# Patient Record
Sex: Male | Born: 1990 | Race: Black or African American | Hispanic: No | Marital: Single | Smoking: Current every day smoker
Health system: Southern US, Community
[De-identification: ages and names within clinical notes are randomized; demographics above are authoritative.]

## PROBLEM LIST (undated history)

## (undated) DIAGNOSIS — J111 Influenza due to unidentified influenza virus with other respiratory manifestations: Secondary | ICD-10-CM

## (undated) HISTORY — DX: Influenza due to unidentified influenza virus with other respiratory manifestations: J11.1

---
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
## 2013-04-14 ENCOUNTER — Encounter (HOSPITAL_COMMUNITY): Payer: Self-pay | Admitting: Emergency Medicine

## 2013-04-14 ENCOUNTER — Emergency Department (INDEPENDENT_AMBULATORY_CARE_PROVIDER_SITE_OTHER)
Admission: EM | Admit: 2013-04-14 | Discharge: 2013-04-14 | Disposition: A | Discharge status: Home / Self Care | Payer: Self-pay | Source: Home / Self Care

## 2013-04-14 DIAGNOSIS — J111 Influenza due to unidentified influenza virus with other respiratory manifestations: Secondary | ICD-10-CM

## 2013-04-14 MED ORDER — OSELTAMIVIR PHOSPHATE 75 MG PO CAPS: 75.0000 mg | ORAL_CAPSULE | Freq: Two times a day (BID) | ORAL | Status: DC

## 2013-04-14 NOTE — Discharge Instructions (Signed)
Influenza, Adult Influenza ("the flu") is a viral infection of the respiratory tract. It occurs more often in winter months because people spend more time in close contact with one another. Influenza can make you feel very sick. Influenza easily spreads from person to person (contagious). CAUSES  Influenza is caused by a virus that infects the respiratory tract. You can catch the virus by breathing in droplets from an infected person's cough or sneeze. You can also catch the virus by touching something that was recently contaminated with the virus and then touching your mouth, nose, or eyes. SYMPTOMS  Symptoms typically last 4 to 10 days and may include:  Fever.  Chills.  Headache, body aches, and muscle aches.  Sore throat.  Chest discomfort and cough.  Poor appetite.  Weakness or feeling tired.  Dizziness.  Nausea or vomiting. DIAGNOSIS  Diagnosis of influenza is often made based on your history and a physical exam. A nose or throat swab test can be done to confirm the diagnosis. RISKS AND COMPLICATIONS You may be at risk for a more severe case of influenza if you smoke cigarettes, have diabetes, have chronic heart disease (such as heart failure) or lung disease (such as asthma), or if you have a weakened immune system. Elderly people and pregnant women are also at risk for more serious infections. The most common complication of influenza is a lung infection (pneumonia). Sometimes, this complication can require emergency medical care and may be life-threatening. PREVENTION  An annual influenza vaccination (flu shot) is the best way to avoid getting influenza. An annual flu shot is now routinely recommended for all adults in the [place] TREATMENT  In mild cases, influenza goes away on its own. Treatment is directed at relieving symptoms. For more severe cases, your caregiver may prescribe antiviral medicines to shorten the sickness. Antibiotic medicines are not effective, because the  infection is caused by a virus, not by bacteria. HOME CARE INSTRUCTIONS  Only take over-the-counter or prescription medicines for pain, discomfort, or fever as directed by your caregiver.  Alka Seltzer Cold Plus Night Time medicine and Robitussin DM. Ibuprofen 200 mg, take 3 tablets every 6 hours as needed.   Use a cool mist humidifier to make breathing easier.  Get plenty of rest until your temperature returns to normal. This usually takes 3 to 4 days.  Drink enough fluids to keep your urine clear or pale yellow.  Cover your mouth and nose when coughing or sneezing, and wash your hands well to avoid spreading the virus.  Stay home from work or school until your fever has been gone for at least 1 full day. SEEK MEDICAL CARE IF:   You have chest pain or a deep cough that worsens or produces more mucus.  You have nausea, vomiting, or diarrhea. SEEK IMMEDIATE MEDICAL CARE IF:   You have difficulty breathing, shortness of breath, or your skin or nails turn bluish.  You have severe neck pain or stiffness.  You have a severe headache, facial pain, or earache.  You have a worsening or recurring fever.  You have nausea or vomiting that cannot be controlled. MAKE SURE YOU:  Understand these instructions.  Will watch your condition.  Will get help right away if you are not doing well or get worse. Document Released: 03/06/2000 Document Revised: 09/08/2011 Document Reviewed: 06/08/2011 [facility] Patient Information 2014 [place], [place].  Viral Infections A viral infection can be caused by different types of viruses.Most viral infections are not serious and resolve on  their own. However, some infections may cause severe symptoms and may lead to further complications. SYMPTOMS Viruses can frequently cause:  Minor sore throat.  Aches and pains.  Headaches.  Runny nose.  Different types of rashes.  Watery eyes.  Tiredness.  Cough.  Loss of appetite.  Gastrointestinal  infections, resulting in nausea, vomiting, and diarrhea. These symptoms do not respond to antibiotics because the infection is not caused by bacteria. However, you might catch a bacterial infection following the viral infection. This is sometimes called a "superinfection." Symptoms of such a bacterial infection may include:  Worsening sore throat with pus and difficulty swallowing.  Swollen neck glands.  Chills and a high or persistent fever.  Severe headache.  Tenderness over the sinuses.  Persistent overall ill feeling (malaise), muscle aches, and tiredness (fatigue).  Persistent cough.  Yellow, green, or brown mucus production with coughing. HOME CARE INSTRUCTIONS   Only take over-the-counter or prescription medicines for pain, discomfort, diarrhea, or fever as directed by your caregiver.  Drink enough water and fluids to keep your urine clear or pale yellow. Sports drinks can provide valuable electrolytes, sugars, and hydration.  Get plenty of rest and maintain proper nutrition. Soups and broths with crackers or rice are fine. SEEK IMMEDIATE MEDICAL CARE IF:   You have severe headaches, shortness of breath, chest pain, neck pain, or an unusual rash.  You have uncontrolled vomiting, diarrhea, or you are unable to keep down fluids.  You or your child has an oral temperature above 102 F (38.9 C), not controlled by medicine.  Your baby is older than 3 months with a rectal temperature of 102 F (38.9 C) or higher.  Your baby is 793 months old or younger with a rectal temperature of 100.4 F (38 C) or higher. MAKE SURE YOU:   Understand these instructions.  Will watch your condition.  Will get help right away if you are not doing well or get worse. Document Released: 12/17/2004 Document Revised: 06/01/2011 Document Reviewed: 07/14/2010 [facility] Patient Information 2014 [place], [place].

## 2013-04-14 NOTE — ED Provider Notes (Signed)
CSN: [id number]     Arrival date & time 04/14/13  1432 History   First MD Initiated Contact with Patient 04/14/13 1600     No chief complaint on file.  (Consider location/radiation/quality/duration/timing/severity/associated sxs/prior Treatment) HPI Comments: 23 year old male states he feels real bad for 2 days. He is complaining of chest burning particularly when he calls, chills, headache, bodyaches, nasal stuffiness, decreased appetite and occasional nausea. He did not receive a flu shot this year.   No past medical history on file. No past surgical history on file. No family history on file. History  Substance Use Topics  . Smoking status: Not on file  . Smokeless tobacco: Not on file  . Alcohol Use: Not on file    Review of Systems  Constitutional: Positive for fever, chills, activity change and appetite change.  HENT: Positive for congestion and postnasal drip. Negative for ear pain and sore throat.   Respiratory: Positive for cough. Negative for shortness of breath and wheezing.   Cardiovascular: Negative.   Gastrointestinal: Positive for nausea and constipation. Negative for vomiting and abdominal pain.  Genitourinary: Negative.   Skin: Negative.   Neurological: Negative.     Allergies  Review of patient's allergies indicates not on file.  Home Medications   Current Outpatient Rx  Name  Route  Sig  Dispense  Refill  . oseltamivir (TAMIFLU) 75 MG capsule   Oral   Take 1 capsule (75 mg total) by mouth every 12 (twelve) hours.   10 capsule   0    BP 119/84  Pulse 92  Temp(Src) 100.2 F (37.9 C) (Oral)  Resp 18  SpO2 99% Physical Exam  Nursing note and vitals reviewed. Constitutional: He is oriented to person, place, and time. He appears well-developed and well-nourished. No distress.  HENT:  Right Ear: External ear normal.  Left Ear: External ear normal.  Mouth/Throat: Oropharynx is clear and moist. No oropharyngeal exudate.  Eyes: Conjunctivae and EOM  are normal.  Neck: Normal range of motion. Neck supple.  Cardiovascular: Normal rate, regular rhythm, normal heart sounds and intact distal pulses.   Pulmonary/Chest: Effort normal and breath sounds normal. No respiratory distress. He has no wheezes.  Abdominal: Soft. There is no tenderness.  Lymphadenopathy:    He has no cervical adenopathy.  Neurological: He is alert and oriented to person, place, and time. He exhibits normal muscle tone.  Skin: Skin is warm and dry.  Psychiatric: He has a normal mood and affect.    ED Course  Procedures (including critical care time) Labs Review Labs Reviewed - No data to display Imaging Review No results found.    MDM   1. Influenza    Alka Seltzer Cold Plus Night time and Robitussin DM and Ibuprofen Plenty of fluids Stay home and rest    Hayden Rasmussenavid Yanissa Michalsky, NP 04/14/13 1617

## 2013-04-14 NOTE — ED Notes (Signed)
C/o general body aches, cough; concern for flu

## 2013-04-16 NOTE — ED Provider Notes (Signed)
Medical screening examination/treatment/procedure(s) were performed by resident physician or non-physician practitioner and as supervising physician I was immediately available for consultation/collaboration.   Jameison Haji DOUGLAS MD.   Prosperity Darrough D Jomel Whittlesey, MD 04/16/13 1149 

## 2014-07-02 ENCOUNTER — Encounter: Payer: Self-pay | Admitting: Family

## 2014-07-02 ENCOUNTER — Ambulatory Visit (INDEPENDENT_AMBULATORY_CARE_PROVIDER_SITE_OTHER): Payer: BLUE CROSS/BLUE SHIELD | Admitting: Family

## 2014-07-02 VITALS — BP 118/82 | HR 62 | Temp 98.6°F | Resp 18 | Ht 68.5 in | Wt 198.0 lb

## 2014-07-02 DIAGNOSIS — R51 Headache: Secondary | ICD-10-CM

## 2014-07-02 DIAGNOSIS — Z0000 Encounter for general adult medical examination without abnormal findings: Secondary | ICD-10-CM | POA: Insufficient documentation

## 2014-07-02 DIAGNOSIS — R519 Headache, unspecified: Secondary | ICD-10-CM | POA: Insufficient documentation

## 2014-07-02 NOTE — Patient Instructions (Signed)
Thank you for choosing [facility].  Summary/Instructions:   Please stop by the lab on the basement level of the building for your blood work. Your results will be released to MyChart (or called to you) after review, usually within 72 hours after test completion. If any changes need to be made, you will be notified at that same time.  Health Maintenance A healthy lifestyle and preventative care can promote health and wellness.  Maintain regular health, dental, and eye exams.  Eat a healthy diet. Foods like vegetables, fruits, whole grains, low-fat dairy products, and lean protein foods contain the nutrients you need and are low in calories. Decrease your intake of foods high in solid fats, added sugars, and salt. Get information about a proper diet from your health care provider, if necessary.  Regular physical exercise is one of the most important things you can do for your health. Most adults should get at least 150 minutes of moderate-intensity exercise (any activity that increases your heart rate and causes you to sweat) each week. In addition, most adults need muscle-strengthening exercises on 2 or more days a week.   Maintain a healthy weight. The body mass index (BMI) is a screening tool to identify possible weight problems. It provides an estimate of body fat based on height and weight. Your health care provider can find your BMI and can help you achieve or maintain a healthy weight. For males 20 years and older:  A BMI below 18.5 is considered underweight.  A BMI of 18.5 to 24.9 is normal.  A BMI of 25 to 29.9 is considered overweight.  A BMI of 30 and above is considered obese.  Maintain normal blood lipids and cholesterol by exercising and minimizing your intake of saturated fat. Eat a balanced diet with plenty of fruits and vegetables. Blood tests for lipids and cholesterol should begin at age 20 and be repeated every 5 years. If your lipid or cholesterol levels are  high, you are over age 50, or you are at high risk for heart disease, you may need your cholesterol levels checked more frequently.Ongoing high lipid and cholesterol levels should be treated with medicines if diet and exercise are not working.  If you smoke, find out from your health care provider how to quit. If you do not use tobacco, do not start.  Lung cancer screening is recommended for adults aged 55-80 years who are at high risk for developing lung cancer because of a history of smoking. A yearly low-dose CT scan of the lungs is recommended for people who have at least a 30-pack-year history of smoking and are current smokers or have quit within the past 15 years. A pack year of smoking is smoking an average of 1 pack of cigarettes a day for 1 year (for example, a 30-pack-year history of smoking could mean smoking 1 pack a day for 30 years or 2 packs a day for 15 years). Yearly screening should continue until the smoker has stopped smoking for at least 15 years. Yearly screening should be stopped for people who develop a health problem that would prevent them from having lung cancer treatment.  If you choose to drink alcohol, do not have more than 2 drinks per day. One drink is considered to be 12 oz (360 mL) of beer, 5 oz (150 mL) of wine, or 1.5 oz (45 mL) of liquor.  Avoid the use of street drugs. Do not share needles with anyone. Ask for help if you need   support or instructions about stopping the use of drugs.  High blood pressure causes heart disease and increases the risk of stroke. Blood pressure should be checked at least every 1-2 years. Ongoing high blood pressure should be treated with medicines if weight loss and exercise are not effective.  If you are 45-79 years old, ask your health care provider if you should take aspirin to prevent heart disease.  Diabetes screening involves taking a blood sample to check your fasting blood sugar level. This should be done once every 3 years  after age 45 if you are at a normal weight and without risk factors for diabetes. Testing should be considered at a younger age or be carried out more frequently if you are overweight and have at least 1 risk factor for diabetes.  Colorectal cancer can be detected and often prevented. Most routine colorectal cancer screening begins at the age of 50 and continues through age 75. However, your health care provider may recommend screening at an earlier age if you have risk factors for colon cancer. On a yearly basis, your health care provider may provide home test kits to check for hidden blood in the stool. A small camera at the end of a tube may be used to directly examine the colon (sigmoidoscopy or colonoscopy) to detect the earliest forms of colorectal cancer. Talk to your health care provider about this at age 50 when routine screening begins. A direct exam of the colon should be repeated every 5-10 years through age 75, unless early forms of precancerous polyps or small growths are found.  People who are at an increased risk for hepatitis B should be screened for this virus. You are considered at high risk for hepatitis B if:  You were born in a country where hepatitis B occurs often. Talk with your health care provider about which countries are considered high risk.  Your parents were born in a high-risk country and you have not received a shot to protect against hepatitis B (hepatitis B vaccine).  You have HIV or AIDS.  You use needles to inject street drugs.  You live with, or have sex with, someone who has hepatitis B.  You are a man who has sex with other men (MSM).  You get hemodialysis treatment.  You take certain medicines for conditions like cancer, organ transplantation, and autoimmune conditions.  Hepatitis C blood testing is recommended for all people born from 1945 through 1965 and any individual with known risk factors for hepatitis C.  Healthy men should no longer receive  prostate-specific antigen (PSA) blood tests as part of routine cancer screening. Talk to your health care provider about prostate cancer screening.  Testicular cancer screening is not recommended for adolescents or adult males who have no symptoms. Screening includes self-exam, a health care provider exam, and other screening tests. Consult with your health care provider about any symptoms you have or any concerns you have about testicular cancer.  Practice safe sex. Use condoms and avoid high-risk sexual practices to reduce the spread of sexually transmitted infections (STIs).  You should be screened for STIs, including gonorrhea and chlamydia if:  You are sexually active and are younger than 24 years.  You are older than 24 years, and your health care provider tells you that you are at risk for this type of infection.  Your sexual activity has changed since you were last screened, and you are at an increased risk for chlamydia or gonorrhea. Ask your health care   provider if you are at risk.  If you are at risk of being infected with HIV, it is recommended that you take a prescription medicine daily to prevent HIV infection. This is called pre-exposure prophylaxis (PrEP). You are considered at risk if:  You are a man who has sex with other men (MSM).  You are a heterosexual man who is sexually active with multiple partners.  You take drugs by injection.  You are sexually active with a partner who has HIV.  Talk with your health care provider about whether you are at high risk of being infected with HIV. If you choose to begin PrEP, you should first be tested for HIV. You should then be tested every 3 months for as long as you are taking PrEP.  Use sunscreen. Apply sunscreen liberally and repeatedly throughout the day. You should seek shade when your shadow is shorter than you. Protect yourself by wearing long sleeves, pants, a wide-brimmed hat, and sunglasses year round whenever you are  outdoors.  Tell your health care provider of new moles or changes in moles, especially if there is a change in shape or color. Also, tell your health care provider if a mole is larger than the size of a pencil eraser.  A one-time screening for abdominal aortic aneurysm (AAA) and surgical repair of large AAAs by ultrasound is recommended for men aged 65-75 years who are current or former smokers.  Stay current with your vaccines (immunizations). Document Released: 09/05/2007 Document Revised: 03/14/2013 Document Reviewed: 08/04/2010 [employer] Patient Information 2015 [employer]. This information is not intended to replace advice given to you by your health care provider. Make sure you discuss any questions you have with your health care provider.   

## 2014-07-02 NOTE — Assessment & Plan Note (Signed)
Patient description of headaches consistent with potential migraines, however is never been diagnosed with migraines. Continue over-the-counter treatment at this time. Recommend trial of Excedrin Migraine for her next headache. If symptoms are not well controlled with over-the-counter medications will pursue first-line migraine medications.

## 2014-07-02 NOTE — Assessment & Plan Note (Addendum)
1) Anticipatory Guidance: Discussed importance of wearing a seatbelt while driving and not texting while driving; changing batteries in smoke detector at least once annually; wearing suntan lotion when outside; eating a balanced and moderate diet; getting physical activity at least 30 minutes per day.  2) Immunizations / Screenings / Labs:  Immunizations up-to-date per recommendations. Patient is due for a vision exam. Patient declines HIV screen. Other screenings are up-to-date per recommendations. Obtain CBC, BMET, Lipid profile and TSH.   Overall well exam. Patient has minimal risk factors for cardiovascular disease. The major one being tobacco use. He is not currently ready to quit. Counseled on the dangers of smoking and the risk for cardiovascular disease and other chronic conditions. Continue other healthy lifestyle behaviors. Follow-up prevention exam in one year. Follow-up office visits pending blood work.

## 2014-07-02 NOTE — Progress Notes (Signed)
Pre visit review using our clinic review tool, if applicable. No additional management support is needed unless otherwise documented below in the visit note. 

## 2014-07-02 NOTE — Progress Notes (Signed)
Subjective:    Patient ID: Barry Johns, male    DOB: Aug 05, 1990, 24 y.o.   MRN: [id number]  Chief Complaint  Patient presents with  . Establish Care    States he gets random headaches at times and didn't know if it had to do with his BP shooting up    HPI:  Barry Johns is a 24 y.o. male who presents today for an annual wellness visit.   1) Health Maintenance -   Diet - Averages about 2-3 meals per day; Mixed and balanced between whole grains, fruits, vegetables and lean meats; Denies caffeine   Exercise - Trys to workout every day/every other day; mixture of cardio and resistance training; average sessions about 2 hours.    2) Preventative Exams / Immunizations:  Dental -- Up to date  Vision -- Due for exam   Health Maintenance  Topic Date Due  . HIV Screening  07/01/2015 (Originally 11/08/2003)  . INFLUENZA VACCINE  10/22/2014  . TETANUS/TDAP  05/24/2017     There is no immunization history on file for this patient.  No Known Allergies  No current outpatient prescriptions on file prior to visit.   No current facility-administered medications on file prior to visit.    Past Medical History  Diagnosis Date  . Influenza     History reviewed. No pertinent past surgical history.  Family History  Problem Relation Age of Onset  . Obesity Mother   . Hypertension Mother   . Diabetes Maternal Grandmother     History   Social History  . Marital Status: Single    Spouse Name: N/A  . Number of Children: 1  . Years of Education: 14   Occupational History  . [employer]    Social History Main Topics  . Smoking status: Current Some Day Smoker    Types: Cigarettes  . Smokeless tobacco: Never Used  . Alcohol Use: Yes     Comment: Occasionally  . Drug Use: No  . Sexual Activity: Not on file   Other Topics Concern  . Not on file   Social History Narrative   Fun: Workout and play basketball and other sports.    Denies any religious beliefs effecting health  care.     Review of Systems  Constitutional: Denies fever, chills, fatigue, or significant weight gain/loss. HENT: Head: Denies neck pain Positive for headaches. Described as throbbing on occasion with associated sensitivity to light and sound. Currently managed with rest and ibuprofen.  Ears: Denies changes in hearing, ringing in ears, earache, drainage Nose: Denies discharge, stuffiness, itching, nosebleed, sinus pain Throat: Denies sore throat, hoarseness, dry mouth, sores, thrush Eyes: Denies loss/changes in vision, pain, redness, blurry/double vision, flashing lights Cardiovascular: Denies chest pain/discomfort, tightness, palpitations, shortness of breath with activity, difficulty lying down, swelling, sudden awakening with shortness of breath Respiratory: Denies shortness of breath, cough, sputum production, wheezing Gastrointestinal: Denies dysphasia, heartburn, change in appetite, nausea, change in bowel habits, rectal bleeding, constipation, diarrhea, yellow skin or eyes Genitourinary: Denies frequency, urgency, burning/pain, blood in urine, incontinence, change in urinary strength. Musculoskeletal: Denies muscle/joint pain, stiffness, back pain, redness or swelling of joints, trauma Skin: Denies rashes, lumps, itching, dryness, color changes, or hair/nail changes Neurological: Denies dizziness, fainting, seizures, weakness, numbness, tingling, tremor Psychiatric - Denies nervousness, stress, depression or memory loss Endocrine: Denies heat or cold intolerance, sweating, frequent urination, excessive thirst, changes in appetite Hematologic: Denies ease of bruising or bleeding     Objective:  BP 118/82 mmHg  Pulse 62  Temp(Src) 98.6 F (37 C) (Oral)  Resp 18  Ht 5' 8.5" (1.74 m)  Wt 198 lb (89.812 kg)  BMI 29.66 kg/m2  SpO2 98% Nursing note and vital signs reviewed.  Physical Exam  Constitutional: He is oriented to person, place, and time. He appears well-developed  and well-nourished.  HENT:  Head: Normocephalic.  Right Ear: Hearing, tympanic membrane, external ear and ear canal normal.  Left Ear: Hearing, tympanic membrane, external ear and ear canal normal.  Nose: Nose normal.  Mouth/Throat: Uvula is midline, oropharynx is clear and moist and mucous membranes are normal.  Eyes: Conjunctivae and EOM are normal. Pupils are equal, round, and reactive to light.  Neck: Neck supple. No JVD present. No tracheal deviation present. No thyromegaly present.  Cardiovascular: Normal rate, regular rhythm, normal heart sounds and intact distal pulses.   Pulmonary/Chest: Effort normal and breath sounds normal.  Abdominal: Soft. Bowel sounds are normal. He exhibits no distension and no mass. There is no tenderness. There is no rebound and no guarding.  Musculoskeletal: Normal range of motion. He exhibits no edema or tenderness.  Lymphadenopathy:    He has no cervical adenopathy.  Neurological: He is alert and oriented to person, place, and time. He has normal reflexes. No cranial nerve deficit. He exhibits normal muscle tone. Coordination normal.  Skin: Skin is warm and dry.  Psychiatric: He has a normal mood and affect. His behavior is normal. Judgment and thought content normal.       Assessment & Plan:

## 2016-01-16 ENCOUNTER — Encounter (HOSPITAL_COMMUNITY): Payer: Self-pay | Admitting: Emergency Medicine

## 2016-01-16 ENCOUNTER — Emergency Department (HOSPITAL_COMMUNITY): Payer: 59

## 2016-01-16 ENCOUNTER — Inpatient Hospital Stay (HOSPITAL_COMMUNITY)
Admission: EM | Admit: 2016-01-16 | Discharge: 2016-01-20 | DRG: 482 | Disposition: A | Discharge status: Home / Self Care | Payer: 59 | Attending: Orthopedic Surgery | Admitting: Orthopedic Surgery

## 2016-01-16 DIAGNOSIS — W3400XA Accidental discharge from unspecified firearms or gun, initial encounter: Secondary | ICD-10-CM | POA: Diagnosis not present

## 2016-01-16 DIAGNOSIS — S72492B Other fracture of lower end of left femur, initial encounter for open fracture type I or II: Secondary | ICD-10-CM | POA: Diagnosis present

## 2016-01-16 DIAGNOSIS — S71132A Puncture wound without foreign body, left thigh, initial encounter: Secondary | ICD-10-CM

## 2016-01-16 DIAGNOSIS — Y249XXA Unspecified firearm discharge, undetermined intent, initial encounter: Secondary | ICD-10-CM

## 2016-01-16 DIAGNOSIS — S72402B Unspecified fracture of lower end of left femur, initial encounter for open fracture type I or II: Secondary | ICD-10-CM | POA: Diagnosis present

## 2016-01-16 DIAGNOSIS — Z419 Encounter for procedure for purposes other than remedying health state, unspecified: Secondary | ICD-10-CM

## 2016-01-16 DIAGNOSIS — R262 Difficulty in walking, not elsewhere classified: Secondary | ICD-10-CM

## 2016-01-16 LAB — CBC
HEMATOCRIT: 46 % (ref 39.0–52.0)
HEMOGLOBIN: 15.9 g/dL (ref 13.0–17.0)
MCH: 30 pg (ref 26.0–34.0)
MCHC: 34.6 g/dL (ref 30.0–36.0)
MCV: 86.8 fL (ref 78.0–100.0)
Platelets: 323 10*3/uL (ref 150–400)
RBC: 5.3 MIL/uL (ref 4.22–5.81)
RDW: 12.5 % (ref 11.5–15.5)
WBC: 13.8 10*3/uL — ABNORMAL HIGH (ref 4.0–10.5)

## 2016-01-16 LAB — BASIC METABOLIC PANEL
Anion gap: 8 (ref 5–15)
BUN: 14 mg/dL (ref 6–20)
CHLORIDE: 103 mmol/L (ref 101–111)
CO2: 26 mmol/L (ref 22–32)
CREATININE: 1.17 mg/dL (ref 0.61–1.24)
Calcium: 8.8 mg/dL — ABNORMAL LOW (ref 8.9–10.3)
GFR calc Af Amer: >60 mL/min (ref >60)
GFR calc non Af Amer: >60 mL/min (ref >60)
Glucose, Bld: 110 mg/dL — ABNORMAL HIGH (ref 65–99)
POTASSIUM: 3.4 mmol/L — AB (ref 3.5–5.1)
SODIUM: 137 mmol/L (ref 135–145)

## 2016-01-16 MED ORDER — METHOCARBAMOL 500 MG PO TABS
500.0000 mg | ORAL_TABLET | Freq: Four times a day (QID) | ORAL | Status: DC | PRN
  Administered 2016-01-17 – 2016-01-20 (×8): 500 mg via ORAL
  Filled 2016-01-16 (×9): qty 1

## 2016-01-16 MED ORDER — ACETAMINOPHEN 325 MG PO TABS: 650.0000 mg | ORAL_TABLET | Freq: Four times a day (QID) | ORAL | Status: DC | PRN

## 2016-01-16 MED ORDER — CEFAZOLIN IN D5W 1 GM/50ML IV SOLN
1.0000 g | Freq: Once | INTRAVENOUS | Status: AC
  Administered 2016-01-16: 1 g via INTRAVENOUS
  Filled 2016-01-16: qty 50

## 2016-01-16 MED ORDER — ACETAMINOPHEN 500 MG PO TABS
1000.0000 mg | ORAL_TABLET | Freq: Four times a day (QID) | ORAL | Status: DC
  Administered 2016-01-16 – 2016-01-20 (×13): 1000 mg via ORAL
  Filled 2016-01-16 (×14): qty 2

## 2016-01-16 MED ORDER — MORPHINE SULFATE (PF) 4 MG/ML IV SOLN
4.0000 mg | INTRAVENOUS | Status: DC | PRN
  Administered 2016-01-16 – 2016-01-18 (×5): 4 mg via INTRAVENOUS
  Filled 2016-01-16 (×5): qty 1

## 2016-01-16 MED ORDER — OXYCODONE HCL 5 MG PO TABS
5.0000 mg | ORAL_TABLET | ORAL | Status: DC | PRN
  Administered 2016-01-17 – 2016-01-20 (×17): 10 mg via ORAL
  Filled 2016-01-16 (×17): qty 2

## 2016-01-16 MED ORDER — CHLORHEXIDINE GLUCONATE 4 % EX LIQD
60.0000 mL | Freq: Once | CUTANEOUS | Status: DC
  Filled 2016-01-16: qty 60

## 2016-01-16 MED ORDER — DEXTROSE 5 % IV SOLN
500.0000 mg | Freq: Four times a day (QID) | INTRAVENOUS | Status: DC | PRN
  Filled 2016-01-16: qty 5

## 2016-01-16 MED ORDER — CEFAZOLIN SODIUM-DEXTROSE 2-4 GM/100ML-% IV SOLN
2.0000 g | INTRAVENOUS | Status: AC
  Administered 2016-01-17: 2 g via INTRAVENOUS
  Filled 2016-01-16: qty 100

## 2016-01-16 MED ORDER — ACETAMINOPHEN 650 MG RE SUPP: 650.0000 mg | Freq: Four times a day (QID) | RECTAL | Status: DC | PRN

## 2016-01-16 MED ORDER — MORPHINE SULFATE (PF) 2 MG/ML IV SOLN
2.0000 mg | INTRAVENOUS | Status: DC | PRN
  Administered 2016-01-17 – 2016-01-18 (×3): 2 mg via INTRAVENOUS
  Filled 2016-01-16 (×3): qty 1

## 2016-01-16 MED ORDER — MORPHINE SULFATE (PF) 4 MG/ML IV SOLN
INTRAVENOUS | Status: AC
  Filled 2016-01-16: qty 1

## 2016-01-16 MED ORDER — POVIDONE-IODINE 10 % EX SWAB: 2.0000 "application " | Freq: Once | CUTANEOUS | Status: DC

## 2016-01-16 MED ORDER — MORPHINE SULFATE (PF) 4 MG/ML IV SOLN
4.0000 mg | Freq: Once | INTRAVENOUS | Status: AC
  Administered 2016-01-16: 4 mg via INTRAVENOUS
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
## 2016-01-16 NOTE — ED Notes (Signed)
ABI 0.96, systolic pressure in R foot is 130, highest systolic pressure in arms is 135. MD notified.
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
## 2016-01-17 ENCOUNTER — Inpatient Hospital Stay (HOSPITAL_COMMUNITY): Payer: 59 | Admitting: Certified Registered"

## 2016-01-17 ENCOUNTER — Encounter (HOSPITAL_COMMUNITY)
Admission: EM | Disposition: A | Discharge status: Home / Self Care | Payer: Self-pay | Source: Home / Self Care | Attending: Orthopedic Surgery

## 2016-01-17 ENCOUNTER — Inpatient Hospital Stay (HOSPITAL_COMMUNITY): Payer: 59

## 2016-01-17 ENCOUNTER — Encounter (HOSPITAL_COMMUNITY): Payer: Self-pay | Admitting: *Deleted

## 2016-01-17 HISTORY — PX: I & D EXTREMITY: SHX5045

## 2016-01-17 HISTORY — PX: FEMUR IM NAIL: SHX1597

## 2016-01-17 LAB — CREATININE, SERUM: Creatinine, Ser: 0.95 mg/dL (ref 0.61–1.24)

## 2016-01-17 LAB — CBC
HEMATOCRIT: 42.4 % (ref 39.0–52.0)
HEMOGLOBIN: 14.7 g/dL (ref 13.0–17.0)
MCH: 29.8 pg (ref 26.0–34.0)
MCHC: 34.7 g/dL (ref 30.0–36.0)
MCV: 85.8 fL (ref 78.0–100.0)
Platelets: 270 10*3/uL (ref 150–400)
RBC: 4.94 MIL/uL (ref 4.22–5.81)
RDW: 12.5 % (ref 11.5–15.5)
WBC: 14.6 10*3/uL — AB (ref 4.0–10.5)

## 2016-01-17 LAB — SURGICAL PCR SCREEN
MRSA, PCR: NEGATIVE
STAPHYLOCOCCUS AUREUS: NEGATIVE

## 2016-01-17 SURGERY — INSERTION, INTRAMEDULLARY ROD, FEMUR, RETROGRADE
Anesthesia: General | Site: Leg Upper | Laterality: Left

## 2016-01-17 MED ORDER — HYDROMORPHONE HCL 1 MG/ML IJ SOLN
0.2500 mg | INTRAMUSCULAR | Status: DC | PRN
  Administered 2016-01-17 (×4): 0.5 mg via INTRAVENOUS

## 2016-01-17 MED ORDER — OXYCODONE HCL 5 MG PO TABS: 5.0000 mg | ORAL_TABLET | Freq: Once | ORAL | Status: DC | PRN

## 2016-01-17 MED ORDER — SUCCINYLCHOLINE CHLORIDE 20 MG/ML IJ SOLN
INTRAMUSCULAR | Status: DC | PRN
  Administered 2016-01-17: 120 mg via INTRAVENOUS

## 2016-01-17 MED ORDER — DEXMEDETOMIDINE HCL 200 MCG/2ML IV SOLN
INTRAVENOUS | Status: DC | PRN
  Administered 2016-01-17: 4 ug via INTRAVENOUS

## 2016-01-17 MED ORDER — ACETAMINOPHEN 650 MG RE SUPP: 650.0000 mg | Freq: Four times a day (QID) | RECTAL | Status: DC | PRN

## 2016-01-17 MED ORDER — SUGAMMADEX SODIUM 200 MG/2ML IV SOLN
INTRAVENOUS | Status: DC | PRN
  Administered 2016-01-17: 180 mg via INTRAVENOUS

## 2016-01-17 MED ORDER — PROPOFOL 10 MG/ML IV BOLUS
INTRAVENOUS | Status: DC | PRN
  Administered 2016-01-17: 200 mg via INTRAVENOUS

## 2016-01-17 MED ORDER — FENTANYL CITRATE (PF) 100 MCG/2ML IJ SOLN
INTRAMUSCULAR | Status: AC
  Filled 2016-01-17: qty 2

## 2016-01-17 MED ORDER — CEFAZOLIN SODIUM-DEXTROSE 2-4 GM/100ML-% IV SOLN
2.0000 g | Freq: Four times a day (QID) | INTRAVENOUS | Status: AC
  Administered 2016-01-17 – 2016-01-18 (×3): 2 g via INTRAVENOUS
  Filled 2016-01-17 (×4): qty 100

## 2016-01-17 MED ORDER — PROPOFOL 10 MG/ML IV BOLUS
INTRAVENOUS | Status: AC
  Filled 2016-01-17: qty 40

## 2016-01-17 MED ORDER — ONDANSETRON HCL 4 MG/2ML IJ SOLN
4.0000 mg | Freq: Four times a day (QID) | INTRAMUSCULAR | Status: DC | PRN
  Administered 2016-01-17: 4 mg via INTRAVENOUS
  Filled 2016-01-17: qty 2

## 2016-01-17 MED ORDER — MIDAZOLAM HCL 2 MG/2ML IJ SOLN
INTRAMUSCULAR | Status: DC | PRN
  Administered 2016-01-17: 2 mg via INTRAVENOUS

## 2016-01-17 MED ORDER — 0.9 % SODIUM CHLORIDE (POUR BTL) OPTIME
TOPICAL | Status: DC | PRN
  Administered 2016-01-17: 1000 mL

## 2016-01-17 MED ORDER — FENTANYL CITRATE (PF) 100 MCG/2ML IJ SOLN
INTRAMUSCULAR | Status: DC | PRN
  Administered 2016-01-17 (×3): 50 ug via INTRAVENOUS
  Administered 2016-01-17: 100 ug via INTRAVENOUS
  Administered 2016-01-17 (×3): 50 ug via INTRAVENOUS

## 2016-01-17 MED ORDER — ONDANSETRON HCL 4 MG/2ML IJ SOLN
INTRAMUSCULAR | Status: AC
  Filled 2016-01-17: qty 2

## 2016-01-17 MED ORDER — ROCURONIUM 10MG/ML (10ML) SYRINGE FOR MEDFUSION PUMP - OPTIME
INTRAVENOUS | Status: DC | PRN
  Administered 2016-01-17: 50 mg via INTRAVENOUS

## 2016-01-17 MED ORDER — FENTANYL CITRATE (PF) 100 MCG/2ML IJ SOLN
INTRAMUSCULAR | Status: AC
Start: 2016-01-17 — End: 2016-01-17
  Filled 2016-01-17: qty 2

## 2016-01-17 MED ORDER — DEXMEDETOMIDINE HCL IN NACL 200 MCG/50ML IV SOLN
INTRAVENOUS | Status: AC
  Filled 2016-01-17: qty 50

## 2016-01-17 MED ORDER — LIDOCAINE HCL (CARDIAC) 20 MG/ML IV SOLN
INTRAVENOUS | Status: DC | PRN
  Administered 2016-01-17: 100 mg via INTRATRACHEAL

## 2016-01-17 MED ORDER — MIDAZOLAM HCL 2 MG/2ML IJ SOLN
INTRAMUSCULAR | Status: AC
  Filled 2016-01-17: qty 2

## 2016-01-17 MED ORDER — METOCLOPRAMIDE HCL 5 MG PO TABS: 5.0000 mg | ORAL_TABLET | Freq: Three times a day (TID) | ORAL | Status: DC | PRN

## 2016-01-17 MED ORDER — SUGAMMADEX SODIUM 200 MG/2ML IV SOLN
INTRAVENOUS | Status: AC
  Filled 2016-01-17: qty 2

## 2016-01-17 MED ORDER — ONDANSETRON HCL 4 MG/2ML IJ SOLN: 4.0000 mg | Freq: Four times a day (QID) | INTRAMUSCULAR | Status: DC | PRN

## 2016-01-17 MED ORDER — OXYCODONE HCL 5 MG/5ML PO SOLN: 5.0000 mg | Freq: Once | ORAL | Status: DC | PRN

## 2016-01-17 MED ORDER — ENOXAPARIN SODIUM 40 MG/0.4ML ~~LOC~~ SOLN
40.0000 mg | SUBCUTANEOUS | Status: DC
  Administered 2016-01-18 – 2016-01-20 (×3): 40 mg via SUBCUTANEOUS
  Filled 2016-01-17 (×3): qty 0.4

## 2016-01-17 MED ORDER — ONDANSETRON HCL 4 MG/2ML IJ SOLN
INTRAMUSCULAR | Status: DC | PRN
  Administered 2016-01-17: 4 mg via INTRAVENOUS

## 2016-01-17 MED ORDER — ACETAMINOPHEN 325 MG PO TABS: 650.0000 mg | ORAL_TABLET | Freq: Four times a day (QID) | ORAL | Status: DC | PRN

## 2016-01-17 MED ORDER — CHLORHEXIDINE GLUCONATE 4 % EX LIQD
CUTANEOUS | Status: AC
  Administered 2016-01-17: 1
  Filled 2016-01-17: qty 15

## 2016-01-17 MED ORDER — LACTATED RINGERS IV SOLN
INTRAVENOUS | Status: DC
Start: 2016-01-17 — End: 2016-01-20
  Administered 2016-01-17 (×2): via INTRAVENOUS

## 2016-01-17 MED ORDER — HYDROMORPHONE HCL 2 MG/ML IJ SOLN
INTRAMUSCULAR | Status: AC
  Administered 2016-01-17: 2 mg
  Filled 2016-01-17: qty 1

## 2016-01-17 MED ORDER — METOCLOPRAMIDE HCL 5 MG/ML IJ SOLN: 5.0000 mg | Freq: Three times a day (TID) | INTRAMUSCULAR | Status: DC | PRN

## 2016-01-17 MED ORDER — FENTANYL CITRATE (PF) 100 MCG/2ML IJ SOLN
INTRAMUSCULAR | Status: AC
  Filled 2016-01-17: qty 4

## 2016-01-17 MED ORDER — WHITE PETROLATUM GEL
Status: AC
  Administered 2016-01-17: 1
  Filled 2016-01-17: qty 1

## 2016-01-17 MED ORDER — ONDANSETRON HCL 4 MG PO TABS: 4.0000 mg | ORAL_TABLET | Freq: Four times a day (QID) | ORAL | Status: DC | PRN

## 2016-01-17 SURGICAL SUPPLY — 84 items
BANDAGE ELASTIC 3 VELCRO ST LF (GAUZE/BANDAGES/DRESSINGS) IMPLANT
BIT DRILL CALIBRATED 4.3MMX365 (DRILL) ×1 IMPLANT
BIT DRILL CROWE PNT TWST 4.5MM (DRILL) ×2 IMPLANT
BLADE SURG 10 STRL SS (BLADE) ×3 IMPLANT
BNDG COHESIVE 1X5 TAN STRL LF (GAUZE/BANDAGES/DRESSINGS) IMPLANT
BNDG COHESIVE 4X5 TAN STRL (GAUZE/BANDAGES/DRESSINGS) ×3 IMPLANT
BNDG COHESIVE 6X5 TAN STRL LF (GAUZE/BANDAGES/DRESSINGS) ×6 IMPLANT
BNDG CONFORM 3 STRL LF (GAUZE/BANDAGES/DRESSINGS) IMPLANT
BNDG ELASTIC 6X10 VLCR STRL LF (GAUZE/BANDAGES/DRESSINGS) ×3 IMPLANT
BNDG GAUZE STRTCH 6 (GAUZE/BANDAGES/DRESSINGS) ×9 IMPLANT
CANISTER SUCT 3000ML PPV (MISCELLANEOUS) ×3 IMPLANT
CAP END OFFSET 12X10 (Cap) IMPLANT
CORDS BIPOLAR (ELECTRODE) IMPLANT
COVER SURGICAL LIGHT HANDLE (MISCELLANEOUS) ×3 IMPLANT
CUFF TOURNIQUET SINGLE 24IN (TOURNIQUET CUFF) IMPLANT
CUFF TOURNIQUET SINGLE 34IN LL (TOURNIQUET CUFF) IMPLANT
CUFF TOURNIQUET SINGLE 44IN (TOURNIQUET CUFF) IMPLANT
DRAPE EXTREMITY BILATERAL (DRAPES) IMPLANT
DRAPE IMP U-DRAPE 54X76 (DRAPES) IMPLANT
DRAPE INCISE IOBAN 66X45 STRL (DRAPES) ×6 IMPLANT
DRAPE ORTHO SPLIT 77X108 STRL (DRAPES) ×2
DRAPE SURG 17X23 STRL (DRAPES) IMPLANT
DRAPE SURG ORHT 6 SPLT 77X108 (DRAPES) ×1 IMPLANT
DRAPE U-SHAPE 47X51 STRL (DRAPES) ×3 IMPLANT
DRILL CALIBRATED 4.3MMX365 (DRILL) ×3
DRILL CROWE POINT TWIST 4.5MM (DRILL) ×6
DURAPREP 26ML APPLICATOR (WOUND CARE) ×3 IMPLANT
ELECT CAUTERY BLADE 6.4 (BLADE) ×3 IMPLANT
ELECT REM PT RETURN 9FT ADLT (ELECTROSURGICAL)
ELECTRODE REM PT RTRN 9FT ADLT (ELECTROSURGICAL) IMPLANT
FACESHIELD WRAPAROUND (MASK) IMPLANT
GAUZE SPONGE 4X4 12PLY STRL (GAUZE/BANDAGES/DRESSINGS) ×3 IMPLANT
GAUZE XEROFORM 1X8 LF (GAUZE/BANDAGES/DRESSINGS) ×3 IMPLANT
GAUZE XEROFORM 5X9 LF (GAUZE/BANDAGES/DRESSINGS) ×3 IMPLANT
GLOVE BIO SURGEON STRL SZ7.5 (GLOVE) ×3 IMPLANT
GLOVE BIOGEL PI IND STRL 8 (GLOVE) ×1 IMPLANT
GLOVE BIOGEL PI INDICATOR 8 (GLOVE) ×2
GOWN STRL REUS W/ TWL LRG LVL3 (GOWN DISPOSABLE) ×2 IMPLANT
GOWN STRL REUS W/ TWL XL LVL3 (GOWN DISPOSABLE) ×1 IMPLANT
GOWN STRL REUS W/TWL LRG LVL3 (GOWN DISPOSABLE) ×4
GOWN STRL REUS W/TWL XL LVL3 (GOWN DISPOSABLE) ×2
GUIDEPIN 3.2X17.5 THRD DISP (PIN) ×3 IMPLANT
GUIDEWIRE BEAD TIP (WIRE) ×3 IMPLANT
HANDPIECE INTERPULSE COAX TIP (DISPOSABLE)
KIT BASIN OR (CUSTOM PROCEDURE TRAY) ×3 IMPLANT
KIT ROOM TURNOVER OR (KITS) ×3 IMPLANT
MANIFOLD NEPTUNE II (INSTRUMENTS) IMPLANT
NAIL FEM RETRO 10.5X360 (Nail) ×3 IMPLANT
NS IRRIG 1000ML POUR BTL (IV SOLUTION) ×6 IMPLANT
PACK ORTHO EXTREMITY (CUSTOM PROCEDURE TRAY) ×3 IMPLANT
PAD ABD 8X10 STRL (GAUZE/BANDAGES/DRESSINGS) ×3 IMPLANT
PAD ARMBOARD 7.5X6 YLW CONV (MISCELLANEOUS) ×6 IMPLANT
PADDING CAST ABS 4INX4YD NS (CAST SUPPLIES) ×4
PADDING CAST ABS COTTON 4X4 ST (CAST SUPPLIES) ×2 IMPLANT
PADDING CAST COTTON 6X4 STRL (CAST SUPPLIES) ×3 IMPLANT
SCREW CORT TI DBL LEAD 5X34 (Screw) ×3 IMPLANT
SCREW CORT TI DBL LEAD 5X36 (Screw) ×3 IMPLANT
SCREW CORT TI DBL LEAD 5X60 (Screw) ×3 IMPLANT
SCREW CORT TI DBL LEAD 5X70 (Screw) ×3 IMPLANT
SET HNDPC FAN SPRY TIP SCT (DISPOSABLE) IMPLANT
SPONGE LAP 18X18 X RAY DECT (DISPOSABLE) ×6 IMPLANT
STAPLER VISISTAT 35W (STAPLE) ×3 IMPLANT
STOCKINETTE IMPERVIOUS 9X36 MD (GAUZE/BANDAGES/DRESSINGS) ×3 IMPLANT
SUCTION FRAZIER TIP 10 FR DISP (SUCTIONS) ×3 IMPLANT
SUT ETHILON 2 0 FS 18 (SUTURE) IMPLANT
SUT ETHILON 2 0 PSLX (SUTURE) IMPLANT
SUT ETHILON 3 0 PS 1 (SUTURE) IMPLANT
SUT VIC AB 0 CT1 27 (SUTURE) ×4
SUT VIC AB 0 CT1 27XBRD ANBCTR (SUTURE) ×2 IMPLANT
SUT VIC AB 2-0 CT1 27 (SUTURE) ×4
SUT VIC AB 2-0 CT1 36 (SUTURE) IMPLANT
SUT VIC AB 2-0 CT1 TAPERPNT 27 (SUTURE) ×2 IMPLANT
SUT VIC AB 2-0 FS1 27 (SUTURE) IMPLANT
SYR CONTROL 10ML LL (SYRINGE) IMPLANT
TOWEL OR 17X24 6PK STRL BLUE (TOWEL DISPOSABLE) ×3 IMPLANT
TOWEL OR 17X26 10 PK STRL BLUE (TOWEL DISPOSABLE) ×3 IMPLANT
TUBE ANAEROBIC SPECIMEN COL (MISCELLANEOUS) IMPLANT
TUBE CONNECTING 12'X1/4 (SUCTIONS) ×1
TUBE CONNECTING 12X1/4 (SUCTIONS) ×2 IMPLANT
TUBE FEEDING 5FR 15 INCH (TUBING) IMPLANT
TUBING CYSTO DISP (UROLOGICAL SUPPLIES) ×3 IMPLANT
UNDERPAD 30X30 (UNDERPADS AND DIAPERS) ×6 IMPLANT
WATER STERILE IRR 1000ML POUR (IV SOLUTION) IMPLANT
YANKAUER SUCT BULB TIP NO VENT (SUCTIONS) ×3 IMPLANT

## 2016-01-17 NOTE — Anesthesia Procedure Notes (Signed)
Procedure Name: Intubation Date/Time: 01/17/2016 3:04 PM Performed by: Rosiland OzMEYERS, Rasheena Talmadge Pre-anesthesia Checklist: Patient identified, Emergency Drugs available, Suction available, Patient being monitored and Timeout performed Patient Re-evaluated:Patient Re-evaluated prior to inductionOxygen Delivery Method: Circle system utilized Preoxygenation: Pre-oxygenation with 100% oxygen Intubation Type: IV induction, Rapid sequence and Cricoid Pressure applied Laryngoscope Size: Miller and 2 Grade View: Grade I Tube type: Oral Tube size: 7.5 mm Number of attempts: 1 Airway Equipment and Method: Stylet Placement Confirmation: ETT inserted through vocal cords under direct vision,  positive ETCO2 and breath sounds checked- equal and bilateral Secured at: 22 cm Tube secured with: Tape Dental Injury: Teeth and Oropharynx as per pre-operative assessment

## 2016-01-17 NOTE — Anesthesia Preprocedure Evaluation (Signed)
Anesthesia Evaluation  Patient identified by MRN, date of birth, ID band Patient awake    Reviewed: Allergy & Precautions, H&P , NPO status , Patient's Chart, lab work & pertinent test results  Airway Mallampati: II   Neck ROM: full    Dental   Pulmonary Current Smoker,    breath sounds clear to auscultation       Cardiovascular negative cardio ROS   Rhythm:regular Rate:Normal     Neuro/Psych    GI/Hepatic   Endo/Other    Renal/GU      Musculoskeletal   Abdominal   Peds  Hematology   Anesthesia Other Findings   Reproductive/Obstetrics                             Anesthesia Physical Anesthesia Plan  ASA: II  Anesthesia Plan: General   Post-op Pain Management:    Induction: Intravenous  Airway Management Planned: Oral ETT  Additional Equipment:   Intra-op Plan:   Post-operative Plan: Extubation in OR  Informed Consent: I have reviewed the patients History and Physical, chart, labs and discussed the procedure including the risks, benefits and alternatives for the proposed anesthesia with the patient or authorized representative who has indicated his/her understanding and acceptance.     Plan Discussed with: CRNA, Anesthesiologist and Surgeon  Anesthesia Plan Comments:         Anesthesia Quick Evaluation

## 2016-01-17 NOTE — H&P (Signed)
H&P update  The surgical history has been reviewed and remains accurate without interval change.  The patient was re-examined and patient's physiologic condition has not changed significantly in the last 30 days. The condition still exists that makes this procedure necessary. The treatment plan remains the same, without new options for care.  No new pharmacological allergies or types of therapy has been initiated that would change the plan or the appropriateness of the plan.  The patient and/or family understand the potential benefits and risks.  Yehonatan Grandison P. [name], MD 01/17/2016 12:49 PM

## 2016-01-17 NOTE — Transfer of Care (Signed)
Immediate Anesthesia Transfer of Care Note  Patient: Barry Johns  Procedure(s) Performed: Procedure(s): INTRAMEDULLARY (IM) RETROGRADE FEMORAL NAILING (Left) IRRIGATION AND DEBRIDEMENT EXTREMITY (Left)  Patient Location: PACU  Anesthesia Type:General  Level of Consciousness: awake, alert  and patient cooperative  Airway & Oxygen Therapy: Patient Spontanous Breathing and Patient connected to nasal cannula oxygen  Post-op Assessment: Report given to RN, Post -op Vital signs reviewed and stable and Patient moving all extremities  Post vital signs: Reviewed and stable  Last Vitals:  Vitals:   01/17/16 1323 01/17/16 1726  BP: 131/66   Pulse: 63   Resp: 17   Temp:  (P) 36.9 C    Last Pain:  Vitals:   01/17/16 0739  TempSrc:   PainSc: 3          Complications: No apparent anesthesia complications
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
## 2016-01-18 LAB — HEMOGLOBIN AND HEMATOCRIT, BLOOD
HEMATOCRIT: 40.2 % (ref 39.0–52.0)
HEMOGLOBIN: 13.9 g/dL (ref 13.0–17.0)

## 2016-01-18 MED ORDER — LABETALOL HCL 5 MG/ML IV SOLN
INTRAVENOUS | Status: AC
  Filled 2016-01-18: qty 4

## 2016-01-18 NOTE — Anesthesia Postprocedure Evaluation (Signed)
Anesthesia Post Note  Patient: Dessie ComaMario T XXXCOLE  Procedure(s) Performed: Procedure(s) (LRB): INTRAMEDULLARY (IM) RETROGRADE FEMORAL NAILING (Left) IRRIGATION AND DEBRIDEMENT EXTREMITY (Left)  Patient location during evaluation: PACU Anesthesia Type: General Level of consciousness: awake Pain management: pain level controlled Vital Signs Assessment: post-procedure vital signs reviewed and stable Respiratory status: spontaneous breathing Cardiovascular status: stable Postop Assessment: no signs of nausea or vomiting Anesthetic complications: no    Last Vitals:  Vitals:   01/18/16 0008 01/18/16 0536  BP: 125/72 112/68  Pulse: 75 64  Resp: 17 17  Temp: 37.2 C 36.9 C    Last Pain:  Vitals:   01/18/16 0642  TempSrc:   PainSc: 6                  Zamaria Brazzle

## 2016-01-18 NOTE — Progress Notes (Signed)
Subjective: 1 Day Post-Op Procedure(s) (LRB): INTRAMEDULLARY (IM) RETROGRADE FEMORAL NAILING (Left) IRRIGATION AND DEBRIDEMENT EXTREMITY (Left) Patient reports pain as moderate.  Reports pain L thigh, most significant at upper thigh close to the groin (incision site). No numbness or tingling. No calf pain. No N/V. No other c/o.  Objective: Vital signs in last 24 hours: Temp:  [97.5 F (36.4 C)-99 F (37.2 C)] 98.5 F (36.9 C) (10/28 0536) Pulse Rate:  [63-92] 64 (10/28 0536) Resp:  [16-19] 17 (10/28 0536) BP: (112-133)/(66-81) 112/68 (10/28 0536) SpO2:  [99 %-100 %] 100 % (10/28 0536)  Intake/Output from previous day: 10/27 0701 - 10/28 0700 In: 1400 [I.V.:1400] Out: 1700 [Urine:1600; Blood:100] Intake/Output this shift: No intake/output data recorded.   Recent Labs  01/16/16 2112 01/17/16 2051  HGB 15.9 14.7    Recent Labs  01/16/16 2112 01/17/16 2051  WBC 13.8* 14.6*  RBC 5.30 4.94  HCT 46.0 42.4  PLT 323 270    Recent Labs  01/16/16 2112 01/17/16 2051  NA 137  --   K 3.4*  --   CL 103  --   CO2 26  --   BUN 14  --   CREATININE 1.17 0.95  GLUCOSE 110*  --   CALCIUM 8.8*  --    No results for input(s): LABPT, INR in the last 72 hours.  Neurologically intact ABD soft Neurovascular intact Sensation intact distally Intact pulses distally Dorsiflexion/Plantar flexion intact Incision: dressing C/D/I and no drainage No cellulitis present Compartment soft no sign of DVT  Mild swelling L thigh  Assessment/Plan: 1 Day Post-Op Procedure(s) (LRB): INTRAMEDULLARY (IM) RETROGRADE FEMORAL NAILING (Left) IRRIGATION AND DEBRIDEMENT EXTREMITY (Left) Advance diet Up with therapy D/C IV fluids  Up with PT today may WBAT LLE with aid of crutches or walker Ice L thigh Check labs to monitor H&H today and tomorrow Possible D/C tomorrow vs. Monday  Baylee Mccorkel M. 01/18/2016, 8:37 AM
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
## 2016-01-19 LAB — HEMOGLOBIN AND HEMATOCRIT, BLOOD
HCT: 38.5 % — ABNORMAL LOW (ref 39.0–52.0)
Hemoglobin: 13.2 g/dL (ref 13.0–17.0)

## 2016-01-19 MED ORDER — OXYCODONE HCL 5 MG PO TABS: 5.0000 mg | ORAL_TABLET | ORAL | 0 refills | Status: DC | PRN

## 2016-01-19 MED ORDER — ACETAMINOPHEN 500 MG PO TABS: 1000.0000 mg | ORAL_TABLET | Freq: Four times a day (QID) | ORAL | 0 refills | Status: AC

## 2016-01-19 MED ORDER — ENOXAPARIN SODIUM 40 MG/0.4ML ~~LOC~~ SOLN: 40.0000 mg | SUBCUTANEOUS | 0 refills | Status: DC

## 2016-01-19 MED ORDER — OXYCODONE HCL 5 MG PO TABS
5.0000 mg | ORAL_TABLET | ORAL | 0 refills | Status: DC | PRN
Start: 2016-01-19 — End: 2016-01-19

## 2016-01-19 MED ORDER — METHOCARBAMOL 500 MG PO TABS: 500.0000 mg | ORAL_TABLET | Freq: Four times a day (QID) | ORAL | 0 refills | Status: DC | PRN
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
## 2016-01-19 NOTE — Progress Notes (Signed)
Patient ID: Barry Johns, male   DOB: 04/21/1990, 25 y.o.   MRN: [id number]   Subjective: 2 Days Post-Op Procedure(s) (LRB): INTRAMEDULLARY (IM) RETROGRADE FEMORAL NAILING (Left) IRRIGATION AND DEBRIDEMENT EXTREMITY (Left)    Patient reports pain as moderate.  Up with therapy a little yesterday, tolerated ok  Objective:   VITALS:   Vitals:   01/19/16 0001 01/19/16 0521  BP:  111/65  Pulse:  67  Resp:  17  Temp: 99.8 F (37.7 C) 98.8 F (37.1 C)    Neurovascular intact Incision: dressing C/D/I  I removed his ACE wrap, underlying dressings without bloody drainage   LABS  Recent Labs  01/16/16 2112 01/17/16 2051 01/18/16 1001 01/19/16 0119  HGB 15.9 14.7 13.9 13.2  HCT 46.0 42.4 40.2 38.5*  WBC 13.8* 14.6*  --   --   PLT 323 270  --   --      Recent Labs  01/16/16 2112 01/17/16 2051  NA 137  --   K 3.4*  --   BUN 14  --   CREATININE 1.17 0.95  GLUCOSE 110*  --     No results for input(s): LABPT, INR in the last 72 hours.   Assessment/Plan: 2 Days Post-Op Procedure(s) (LRB): INTRAMEDULLARY (IM) RETROGRADE FEMORAL NAILING (Left) IRRIGATION AND DEBRIDEMENT EXTREMITY (Left)   Plan:  Discussed discharge issues with him today Pending therapy progress and stairs he may decide to go this pm or tomorrow WB status per orders from [employer] Encourage LLE isometrics and activity

## 2016-01-19 NOTE — Progress Notes (Signed)
Orthopedic Tech Progress Note Patient Details:  Barry Johns 10/05/1990 [id number]  Ortho Devices Type of Ortho Device: Crutches Ortho Device/Splint Interventions: Application   Barry Johns, Barry Johns 01/19/2016, 12:23 PM

## 2016-01-19 NOTE — Progress Notes (Signed)
Pt and family now stating they would prefer to stay another night. Spoke with Dr. Charlann Boxerlin earlier in the day that pt was apprehensive after his PT session about leaving today, and Dr. Charlann Boxerlin said he thought Dr. [name] would be fine with pt leaving tomorrow. Pt made good progress with PT and is walking in the hallways with family members using a rolling walker. Pain seems to be controlled with current regimen. Will continue to monitor

## 2016-01-19 NOTE — Progress Notes (Signed)
Orthopedic Tech Progress Note Patient Details:  Barry Johns 1990-08-04 [id number]  Patient ID: Barry Johns, male   DOB: 1990-08-04, 25 y.o.   MRN: [id number]   Nikki DomCrawford, Artemisa Sladek 01/19/2016, 12:23 PM Viewed order from doctor's order list
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
## 2016-01-20 ENCOUNTER — Encounter (HOSPITAL_COMMUNITY): Payer: Self-pay | Admitting: Orthopedic Surgery

## 2016-01-20 ENCOUNTER — Encounter: Payer: Self-pay | Admitting: Family

## 2016-01-20 ENCOUNTER — Encounter (HOSPITAL_COMMUNITY): Payer: Self-pay

## 2016-01-20 NOTE — Progress Notes (Signed)
Pt discharging to home via [facility]vt auto home with family. All discharge instructions reviewed with pt and rx with pt. No distress noted. DME with pt at discharge.

## 2016-01-20 NOTE — Progress Notes (Signed)
Visited with pt and his mom prior to his discharge. Mom was extremely grateful for visit and ministrations to pt in ED and to sister who came Th nite when he did. Mom said pt's grandmother, her mom, was a Optician, dispensingminister -- and mom was a praying person. The three of us joined hands in prayers of thanksgiving for pt's positive outcome, and, after I invited mom to pray as well, she prayed constantly Hallelujah, thank you, Jesus. Pt did not pray aloud but was appreciative of the prayer, visit, and ministry Th nite. Chaplain available for follow-up.   01/20/16 1200  Clinical Encounter Type  Visited With Patient and family together  Visit Type Follow-up;Psychological support;Spiritual support;Post-op  Referral From Chaplain  Spiritual Encounters  Spiritual Needs Prayer;Emotional  Stress Factors  Patient Stress Factors Health changes;Loss of control  Family Stress Factors Family relationships;Health changes
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
## 2017-03-28 ENCOUNTER — Emergency Department (HOSPITAL_COMMUNITY): Payer: 59

## 2017-03-28 ENCOUNTER — Emergency Department (HOSPITAL_COMMUNITY)
Admission: EM | Admit: 2017-03-28 | Discharge: 2017-03-28 | Disposition: A | Discharge status: Home / Self Care | Payer: 59 | Attending: Emergency Medicine | Admitting: Emergency Medicine

## 2017-03-28 ENCOUNTER — Encounter (HOSPITAL_COMMUNITY): Payer: Self-pay | Admitting: Nurse Practitioner

## 2017-03-28 DIAGNOSIS — F17210 Nicotine dependence, cigarettes, uncomplicated: Secondary | ICD-10-CM | POA: Insufficient documentation

## 2017-03-28 DIAGNOSIS — R509 Fever, unspecified: Secondary | ICD-10-CM | POA: Insufficient documentation

## 2017-03-28 DIAGNOSIS — Z79899 Other long term (current) drug therapy: Secondary | ICD-10-CM | POA: Diagnosis not present

## 2017-03-28 DIAGNOSIS — J069 Acute upper respiratory infection, unspecified: Secondary | ICD-10-CM | POA: Insufficient documentation

## 2017-03-28 DIAGNOSIS — B9789 Other viral agents as the cause of diseases classified elsewhere: Secondary | ICD-10-CM | POA: Diagnosis not present

## 2017-03-28 DIAGNOSIS — R05 Cough: Secondary | ICD-10-CM | POA: Diagnosis present

## 2017-03-28 LAB — RAPID STREP SCREEN (MED CTR MEBANE ONLY): STREPTOCOCCUS, GROUP A SCREEN (DIRECT): NEGATIVE

## 2017-03-28 MED ORDER — BENZONATATE 100 MG PO CAPS: 100.0000 mg | ORAL_CAPSULE | Freq: Three times a day (TID) | ORAL | 0 refills | Status: DC
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
## 2017-03-28 NOTE — ED Triage Notes (Signed)
Pt c/o productive cough and sore throat. Also noticed that the sputum is red/pink colored and OTC medication shave not helped.

## 2017-03-31 LAB — CULTURE, GROUP A STREP (THRC)

## 2017-06-29 ENCOUNTER — Encounter (HOSPITAL_COMMUNITY): Payer: Self-pay | Admitting: *Deleted

## 2017-06-29 ENCOUNTER — Emergency Department (HOSPITAL_COMMUNITY)
Admission: EM | Admit: 2017-06-29 | Discharge: 2017-06-29 | Disposition: A | Discharge status: Home / Self Care | Payer: 59 | Attending: Emergency Medicine | Admitting: Emergency Medicine

## 2017-06-29 DIAGNOSIS — Z79899 Other long term (current) drug therapy: Secondary | ICD-10-CM | POA: Diagnosis not present

## 2017-06-29 DIAGNOSIS — F17210 Nicotine dependence, cigarettes, uncomplicated: Secondary | ICD-10-CM | POA: Diagnosis not present

## 2017-06-29 DIAGNOSIS — R070 Pain in throat: Secondary | ICD-10-CM | POA: Diagnosis present

## 2017-06-29 DIAGNOSIS — J039 Acute tonsillitis, unspecified: Secondary | ICD-10-CM | POA: Diagnosis not present

## 2017-06-29 LAB — RAPID STREP SCREEN (MED CTR MEBANE ONLY): Streptococcus, Group A Screen (Direct): NEGATIVE

## 2017-06-29 MED ORDER — CLINDAMYCIN HCL 300 MG PO CAPS: 300.0000 mg | ORAL_CAPSULE | Freq: Three times a day (TID) | ORAL | 0 refills | Status: AC

## 2017-06-29 MED ORDER — CLINDAMYCIN HCL 300 MG PO CAPS
300.0000 mg | ORAL_CAPSULE | Freq: Once | ORAL | Status: AC
  Administered 2017-06-29: 300 mg via ORAL
  Filled 2017-06-29: qty 1

## 2017-06-29 MED ORDER — ACETAMINOPHEN 500 MG PO TABS
1000.0000 mg | ORAL_TABLET | Freq: Once | ORAL | Status: AC
  Administered 2017-06-29: 1000 mg via ORAL
  Filled 2017-06-29: qty 2

## 2017-06-29 MED ORDER — IBUPROFEN 200 MG PO TABS
600.0000 mg | ORAL_TABLET | Freq: Once | ORAL | Status: AC
  Administered 2017-06-29: 600 mg via ORAL
  Filled 2017-06-29: qty 3

## 2017-06-29 NOTE — ED Notes (Signed)
Bed: WA01 Expected date:  Expected time:  Means of arrival:  Comments: 
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
## 2017-06-29 NOTE — ED Triage Notes (Signed)
Pt complains of sore throat, fever, chills, generalized body aches for the past few days.

## 2017-06-30 LAB — CULTURE, GROUP A STREP (THRC)

## 2019-06-02 ENCOUNTER — Emergency Department (HOSPITAL_COMMUNITY)
Admission: EM | Admit: 2019-06-02 | Discharge: 2019-06-03 | Disposition: A | Discharge status: Home / Self Care | Payer: No Typology Code available for payment source | Attending: Emergency Medicine | Admitting: Emergency Medicine

## 2019-06-02 ENCOUNTER — Encounter (HOSPITAL_COMMUNITY): Payer: Self-pay | Admitting: Emergency Medicine

## 2019-06-02 ENCOUNTER — Emergency Department (HOSPITAL_COMMUNITY): Payer: No Typology Code available for payment source

## 2019-06-02 ENCOUNTER — Other Ambulatory Visit: Payer: Self-pay

## 2019-06-02 DIAGNOSIS — Y92410 Unspecified street and highway as the place of occurrence of the external cause: Secondary | ICD-10-CM | POA: Insufficient documentation

## 2019-06-02 DIAGNOSIS — M542 Cervicalgia: Secondary | ICD-10-CM | POA: Diagnosis not present

## 2019-06-02 DIAGNOSIS — Y999 Unspecified external cause status: Secondary | ICD-10-CM | POA: Diagnosis not present

## 2019-06-02 DIAGNOSIS — F17210 Nicotine dependence, cigarettes, uncomplicated: Secondary | ICD-10-CM | POA: Diagnosis not present

## 2019-06-02 DIAGNOSIS — M545 Low back pain: Secondary | ICD-10-CM | POA: Insufficient documentation

## 2019-06-02 DIAGNOSIS — S66911A Strain of unspecified muscle, fascia and tendon at wrist and hand level, right hand, initial encounter: Secondary | ICD-10-CM | POA: Insufficient documentation

## 2019-06-02 DIAGNOSIS — Y93I9 Activity, other involving external motion: Secondary | ICD-10-CM | POA: Diagnosis not present

## 2019-06-02 DIAGNOSIS — S0990XA Unspecified injury of head, initial encounter: Secondary | ICD-10-CM | POA: Diagnosis present

## 2019-06-02 DIAGNOSIS — Z79899 Other long term (current) drug therapy: Secondary | ICD-10-CM | POA: Diagnosis not present

## 2019-06-02 DIAGNOSIS — T07XXXA Unspecified multiple injuries, initial encounter: Secondary | ICD-10-CM

## 2019-06-02 MED ORDER — OXYCODONE-ACETAMINOPHEN 5-325 MG PO TABS
1.0000 | ORAL_TABLET | ORAL | Status: DC | PRN
  Administered 2019-06-02: 1 via ORAL
  Filled 2019-06-02: qty 1
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
## 2019-06-02 NOTE — Discharge Instructions (Addendum)
Use ice on sore spots 3 times a day for 2 days after that use heat.  Wear the right wrist splint as needed for comfort.  Use Motrin or Tylenol for pain.
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
